# Patient Record
Sex: Female | Born: 1995
Health system: Southern US, Community
[De-identification: ages and names within clinical notes are randomized; demographics above are authoritative.]

## PROBLEM LIST (undated history)

## (undated) DIAGNOSIS — Q676 Pectus excavatum: Secondary | ICD-10-CM

## (undated) HISTORY — PX: WISDOM TOOTH EXTRACTION: SHX21

---
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
## 2020-03-31 ENCOUNTER — Other Ambulatory Visit: Payer: Self-pay

## 2021-09-28 ENCOUNTER — Encounter (HOSPITAL_COMMUNITY): Payer: Self-pay | Admitting: Emergency Medicine

## 2021-09-28 ENCOUNTER — Emergency Department (HOSPITAL_COMMUNITY)
Admission: EM | Admit: 2021-09-28 | Discharge: 2021-09-28 | Disposition: A | Discharge status: Home / Self Care | Payer: BC Managed Care – PPO | Attending: Emergency Medicine | Admitting: Emergency Medicine

## 2021-09-28 ENCOUNTER — Other Ambulatory Visit: Payer: Self-pay

## 2021-09-28 DIAGNOSIS — Z711 Person with feared health complaint in whom no diagnosis is made: Secondary | ICD-10-CM | POA: Insufficient documentation

## 2021-09-28 DIAGNOSIS — R531 Weakness: Secondary | ICD-10-CM | POA: Insufficient documentation

## 2021-09-28 DIAGNOSIS — I10 Essential (primary) hypertension: Secondary | ICD-10-CM | POA: Diagnosis not present

## 2021-09-28 LAB — URINALYSIS, ROUTINE W REFLEX MICROSCOPIC
Bilirubin Urine: NEGATIVE
Glucose, UA: NEGATIVE mg/dL
Hgb urine dipstick: NEGATIVE
Ketones, ur: NEGATIVE mg/dL
Nitrite: NEGATIVE
Protein, ur: NEGATIVE mg/dL
Specific Gravity, Urine: 1.014 (ref 1.005–1.030)
pH: 8 (ref 5.0–8.0)

## 2021-09-28 LAB — BASIC METABOLIC PANEL
Anion gap: 7 (ref 5–15)
BUN: 13 mg/dL (ref 6–20)
CO2: 26 mmol/L (ref 22–32)
Calcium: 9.6 mg/dL (ref 8.9–10.3)
Chloride: 106 mmol/L (ref 98–111)
Creatinine, Ser: 0.69 mg/dL (ref 0.44–1.00)
GFR, Estimated: >60 mL/min (ref >60)
Glucose, Bld: 120 mg/dL — ABNORMAL HIGH (ref 70–99)
Potassium: 4.3 mmol/L (ref 3.5–5.1)
Sodium: 139 mmol/L (ref 135–145)

## 2021-09-28 LAB — CBC
HCT: 46.1 % — ABNORMAL HIGH (ref 36.0–46.0)
Hemoglobin: 15.8 g/dL — ABNORMAL HIGH (ref 12.0–15.0)
MCH: 31.3 pg (ref 26.0–34.0)
MCHC: 34.3 g/dL (ref 30.0–36.0)
MCV: 91.3 fL (ref 80.0–100.0)
Platelets: 287 10*3/uL (ref 150–400)
RBC: 5.05 MIL/uL (ref 3.87–5.11)
RDW: 12.7 % (ref 11.5–15.5)
WBC: 7.7 10*3/uL (ref 4.0–10.5)
nRBC: 0 % (ref 0.0–0.2)

## 2021-09-28 LAB — I-STAT BETA HCG BLOOD, ED (MC, WL, AP ONLY): I-stat hCG, quantitative: <5 m[IU]/mL (ref <5)

## 2021-09-28 MED ORDER — SODIUM CHLORIDE 0.9 % IV BOLUS
1000.0000 mL | Freq: Once | INTRAVENOUS | Status: AC
  Administered 2021-09-28: 1000 mL via INTRAVENOUS

## 2021-09-28 NOTE — ED Triage Notes (Addendum)
Patient repots not eating since Sunday and not having much of an appetite. Starting Tuesday started to feel weak and jittery. Reports cough with white phlegm.   Patient adds she might have an eating disorder and is concerned she is very malnurished. States when trying to eat vegetables she will chew on them but the thought of them makes her nauseated and spits them out.

## 2021-09-28 NOTE — Discharge Instructions (Signed)
You were seen today for concerns of dehydration and requests for resources for outpatient assistance with eating disorders. I have provided a list of outpatient counseling services in the area. There are many different options. Please follow up with your primary care if possible for assistance as well.

## 2021-09-28 NOTE — ED Provider Notes (Signed)
[facility] Provider Note   CSN: [id number] Arrival date & time: 09/28/21  1194     History  Chief Complaint  Patient presents with   Weakness    Kristin Hodges is a 26 y.o. female.  Patient presents to the emergency department with concerns that she may have an eating disorder.  She states that she has had issues with foods over much of her life and is unable to eat vegetables due to texture.  She states that on Tuesday she felt a little bit weak and jittery.  She says she has not eaten since Sunday.  Past medical history significant for history of drug overdose, tobacco abuse, altered mental status, hypertension, hypokalemia, hypoglycemia  HPI     Home Medications Prior to Admission medications   Medication Sig Start Date End Date Taking? Authorizing Provider  acetaminophen (TYLENOL) 500 MG tablet Take 1,000 mg by mouth every 6 (six) hours as needed. For headache    [provider]      Allergies    Patient has no known allergies.    Review of Systems   Review of Systems  Constitutional:  Positive for appetite change.  Respiratory:  Negative for shortness of breath.   Cardiovascular:  Negative for chest pain.  Gastrointestinal:  Negative for abdominal pain.  Genitourinary:  Negative for dysuria.    Physical Exam Updated Vital Signs BP 117/84   Pulse 84   Temp 99.2 F (37.3 C) (Oral)   Resp 18   Ht 5\' 3"  (1.6 m)   Wt 38.1 kg   LMP 09/27/2021   SpO2 100%   BMI 14.88 kg/m  Physical Exam Vitals and nursing note reviewed.  Constitutional:      General: She is not in acute distress. HENT:     Head: Normocephalic and atraumatic.     Mouth/Throat:     Mouth: Mucous membranes are moist.  Eyes:     Conjunctiva/sclera: Conjunctivae normal.  Cardiovascular:     Rate and Rhythm: Normal rate and regular rhythm.     Pulses: Normal pulses.     Heart sounds: Normal heart sounds.  Pulmonary:     Effort: Pulmonary effort is  normal.     Breath sounds: Normal breath sounds.  Musculoskeletal:        General: Normal range of motion.     Cervical back: Normal range of motion and neck supple.  Skin:    General: Skin is warm and dry.     Capillary Refill: Capillary refill takes less than 2 seconds.  Neurological:     Mental Status: She is alert and oriented to person, place, and time.     ED Results / Procedures / Treatments   Labs (all labs ordered are listed, but only abnormal results are displayed) Labs Reviewed  BASIC METABOLIC PANEL - Abnormal; Notable for the following components:      Result Value   Glucose, Bld 120 (*)    All other components within normal limits  CBC - Abnormal; Notable for the following components:   Hemoglobin 15.8 (*)    HCT 46.1 (*)    All other components within normal limits  URINALYSIS, ROUTINE W REFLEX MICROSCOPIC - Abnormal; Notable for the following components:   Leukocytes,Ua TRACE (*)    Bacteria, UA RARE (*)    All other components within normal limits  I-STAT BETA HCG BLOOD, ED ([facility], [facility], [facility] ONLY)    EKG None  Radiology No results found.  Procedures Procedures    Medications Ordered in ED Medications  sodium chloride 0.9 % bolus 1,000 mL (1,000 mLs Intravenous New Bag/Given 09/28/21 1314)    ED Course/ Medical Decision Making/ A&P                           Medical Decision Making Amount and/or Complexity of Data Reviewed Labs: ordered.   Patient presents with a chief concern of possible malnutrition due to possible eating disorder.  I ordered and reviewed basic labs.  Trace leukocytes on urinalysis, negative pregnancy test, hemoglobin 15.8, glucose 120, rest of BMP within normal limits  I ordered the patient a liter of saline to help with her hydration.  Upon reassessment the patient felt much better and seemed much more comfortable  I have talked to the patient and listened to her concerns about having a possible eating disorder.  There are no  significant abnormalities on her basic lab work today.  Normal electrolytes.  I see no reason for admission at this time.  I have offered outpatient resources to the patient to help with her potential eating disorder.  The patient plans to follow-up outpatient.  Discharge home        Final Clinical Impression(s) / ED Diagnoses Final diagnoses:  Weakness  Mental health-related complaint    Rx / DC Orders ED Discharge Orders     None         Pamala Duffel 09/28/21 1441    Lorre Nick, MD 09/29/21 [phone]

## 2021-09-28 NOTE — ED Notes (Signed)
Pt. Unable to sign discharge agreement d/t unavailable signature pad. Pt. Is understanding of findings, follow-up and return precautions. Pt. Verbalized understanding of above.

## 2021-10-25 ENCOUNTER — Encounter (HOSPITAL_COMMUNITY): Payer: Self-pay | Admitting: Emergency Medicine

## 2021-11-03 ENCOUNTER — Emergency Department (HOSPITAL_BASED_OUTPATIENT_CLINIC_OR_DEPARTMENT_OTHER)
Admission: EM | Admit: 2021-11-03 | Discharge: 2021-11-03 | Disposition: A | Discharge status: Home / Self Care | Payer: BC Managed Care – PPO | Attending: Emergency Medicine | Admitting: Emergency Medicine

## 2021-11-03 ENCOUNTER — Emergency Department (HOSPITAL_BASED_OUTPATIENT_CLINIC_OR_DEPARTMENT_OTHER): Payer: BC Managed Care – PPO | Admitting: Radiology

## 2021-11-03 ENCOUNTER — Other Ambulatory Visit: Payer: Self-pay

## 2021-11-03 ENCOUNTER — Encounter (HOSPITAL_BASED_OUTPATIENT_CLINIC_OR_DEPARTMENT_OTHER): Payer: Self-pay

## 2021-11-03 DIAGNOSIS — M7989 Other specified soft tissue disorders: Secondary | ICD-10-CM | POA: Diagnosis not present

## 2021-11-03 DIAGNOSIS — R079 Chest pain, unspecified: Secondary | ICD-10-CM | POA: Diagnosis present

## 2021-11-03 DIAGNOSIS — R072 Precordial pain: Secondary | ICD-10-CM | POA: Insufficient documentation

## 2021-11-03 DIAGNOSIS — R0602 Shortness of breath: Secondary | ICD-10-CM | POA: Diagnosis not present

## 2021-11-03 LAB — BASIC METABOLIC PANEL
Anion gap: 8 (ref 5–15)
BUN: 11 mg/dL (ref 6–20)
CO2: 26 mmol/L (ref 22–32)
Calcium: 9.5 mg/dL (ref 8.9–10.3)
Chloride: 101 mmol/L (ref 98–111)
Creatinine, Ser: 0.65 mg/dL (ref 0.44–1.00)
GFR, Estimated: >60 mL/min (ref >60)
Glucose, Bld: 78 mg/dL (ref 70–99)
Potassium: 3.6 mmol/L (ref 3.5–5.1)
Sodium: 135 mmol/L (ref 135–145)

## 2021-11-03 LAB — CBC
HCT: 41 % (ref 36.0–46.0)
Hemoglobin: 14.1 g/dL (ref 12.0–15.0)
MCH: 30.6 pg (ref 26.0–34.0)
MCHC: 34.4 g/dL (ref 30.0–36.0)
MCV: 88.9 fL (ref 80.0–100.0)
Platelets: 270 10*3/uL (ref 150–400)
RBC: 4.61 MIL/uL (ref 3.87–5.11)
RDW: 12 % (ref 11.5–15.5)
WBC: 6.9 10*3/uL (ref 4.0–10.5)
nRBC: 0 % (ref 0.0–0.2)

## 2021-11-03 LAB — TROPONIN I (HIGH SENSITIVITY): Troponin I (High Sensitivity): <2 ng/L (ref <18)

## 2021-11-03 LAB — D-DIMER, QUANTITATIVE: D-Dimer, Quant: 0.46 ug/mL-FEU (ref 0.00–0.50)

## 2021-11-03 LAB — HCG, SERUM, QUALITATIVE: Preg, Serum: NEGATIVE

## 2021-11-03 NOTE — ED Triage Notes (Signed)
Patient here POV from Home.  Endorses Intermittent CP that has been present for a few weeks. Mostly   Some SOB associated with Same. No Fevers. No N/V.   NAD Noted during Triage. A&Ox4. GCS 15. Ambulatory.

## 2021-11-03 NOTE — ED Provider Notes (Signed)
[facility] EMERGENCY DEPT Provider Note   CSN: [id number] Arrival date & time: 11/03/21  1426    History  Chief Complaint  Patient presents with   Chest Pain    Kristin Hodges is a 26 y.o. female here for evaluation of chest pain.  Has had intermittent chest pain over the last month.  Not necessarily exertional or pleuritic in nature.  Can last for minutes to hours.  No palpitations. Some associated SOB.  Was previously under a lot of stress  however states stress has improved and her symptoms have not improved.  Does not radiate to left arm, left back or jaw.  Pain to central and right portion of her chest.  Pain can wake her up out of her sleep.  Lower extremity swelling, pain, numbness or weakness.  No history of thyroid problems, no history of PE or DVT.  No personal or family history of sudden cardiac death.  HPI     Home Medications Prior to Admission medications   Medication Sig Start Date End Date Taking? Authorizing Provider  acetaminophen (TYLENOL) 500 MG tablet Take 1,000 mg by mouth every 6 (six) hours as needed. For headache    [provider]      Allergies    Patient has no known allergies.    Review of Systems   Review of Systems  Constitutional: Negative.   HENT: Negative.    Respiratory:  Positive for shortness of breath. Negative for cough, wheezing and stridor.   Cardiovascular:  Positive for chest pain. Negative for palpitations and leg swelling.  Gastrointestinal: Negative.   Genitourinary: Negative.   Musculoskeletal: Negative.   Skin: Negative.   Neurological: Negative.   All other systems reviewed and are negative.   Physical Exam Updated Vital Signs BP 117/80   Pulse 65   Temp 98.8 F (37.1 C)   Resp 13   Ht 5\' 3"  (1.6 m)   Wt 44.5 kg   SpO2 100%   BMI 17.36 kg/m  Physical Exam Vitals and nursing note reviewed.  Constitutional:      General: She is not in acute distress.    Appearance: She is well-developed. She  is not ill-appearing, toxic-appearing or diaphoretic.  HENT:     Head: Atraumatic.  Eyes:     Pupils: Pupils are equal, round, and reactive to light.  Cardiovascular:     Rate and Rhythm: Normal rate.     Pulses:          Radial pulses are 2+ on the right side and 2+ on the left side.     Heart sounds: Normal heart sounds.  Pulmonary:     Effort: Pulmonary effort is normal. No respiratory distress.     Breath sounds: Normal breath sounds.  Chest:     Chest wall: No mass, deformity, tenderness or crepitus.  Abdominal:     General: Bowel sounds are normal. There is no distension.     Palpations: Abdomen is soft.  Musculoskeletal:        General: Normal range of motion.     Cervical back: Normal range of motion.     Right lower leg: No tenderness. No edema.     Left lower leg: No tenderness. No edema.  Skin:    General: Skin is warm and dry.     Capillary Refill: Capillary refill takes less than 2 seconds.  Neurological:     General: No focal deficit present.     Mental Status: She is  alert.  Psychiatric:        Mood and Affect: Mood normal.     ED Results / Procedures / Treatments   Labs (all labs ordered are listed, but only abnormal results are displayed) Labs Reviewed  BASIC METABOLIC PANEL  CBC  HCG, SERUM, QUALITATIVE  D-DIMER, QUANTITATIVE  TROPONIN I (HIGH SENSITIVITY)  TROPONIN I (HIGH SENSITIVITY)    EKG None  Radiology DG Chest 2 View  Result Date: 11/03/2021 CLINICAL DATA:  Chest pain in a 26 year old female. EXAM: CHEST - 2 VIEW COMPARISON:  June 23, 2010 FINDINGS: Trachea midline. Cardiomediastinal contours and hilar structures are normal. No lobar consolidation or effusion. Lungs are hyperinflated with increased retrosternal clear space. Mild central airway thickening. On limited assessment no acute skeletal findings. Query mild deformity of the inferior sternum and pectus excavatum. IMPRESSION: Hyperinflation and mild central airway thickening  suggesting reactive airway disease or bronchitis. No lobar consolidation or effusion. Query mild pectus excavatum and mild deformity of the inferior sternum. Correlate with any point tenderness in this area or any history of recent trauma. Electronically Signed   By: Donzetta Kohut M.D.   On: 11/03/2021 15:45    Procedures Procedures    Medications Ordered in ED Medications - No data to display  ED Course/ Medical Decision Making/ A&P    26 year old here for evaluation of intermittent chest pain over the last month or so.  No clear precipitating events.  Nonexertional, nonpleuritic in nature.  She is no clinical evidence of VTE on exam.  There is some mild tachycardia when I was examining her to 100s, cannot PERC.  Symptoms do not seem consistent with ACS, dissection, infectious process no associated cough.  Labs and imaging personally viewed and interpreted:  CBC without leukocytosis Metabolic panel without significant normality Pregnancy test negative Troponin less than 2 Chest x-ray with possible reactive airway disease, possible mild pectus excavatum EKG without ischemic changes Ddimer 0.46  Reassessed.  Stated just leave the emergency department right away for an emergency.  Her work-up thus far is unremarkable.  Her symptoms have been ongoing over the last month.  Do not feel she needs a delta troponin.  We will have her follow-up outpatient with PCP or cardiology.  Can return for new or worsening symptoms.  The patient has been appropriately medically screened and/or stabilized in the ED. I have low suspicion for any other emergent medical condition which would require further screening, evaluation or treatment in the ED or require inpatient management.  Patient is hemodynamically stable and in no acute distress.  Patient able to ambulate in department prior to ED.  Evaluation does not show acute pathology that would require ongoing or additional emergent interventions while in the  emergency department or further inpatient treatment.  I have discussed the diagnosis with the patient and answered all questions.  Pain is been managed while in the emergency department and patient has no further complaints prior to discharge.  Patient is comfortable with plan discussed in room and is stable for discharge at this time.  I have discussed strict return precautions for returning to the emergency department.  Patient was encouraged to follow-up with PCP/specialist refer to at discharge.                           Medical Decision Making Amount and/or Complexity of Data Reviewed Independent Historian: friend External Data Reviewed: labs, radiology, ECG and notes. Labs: ordered. Decision-making details documented in  ED Course. Radiology: ordered and independent interpretation performed. Decision-making details documented in ED Course. ECG/medicine tests: ordered and independent interpretation performed. Decision-making details documented in ED Course.  Risk OTC drugs. Prescription drug management. Parenteral controlled substances. Decision regarding hospitalization. Diagnosis or treatment significantly limited by social determinants of health.          Final Clinical Impression(s) / ED Diagnoses Final diagnoses:  Precordial pain    Rx / DC Orders ED Discharge Orders     None         Adilee Lemme A, PA-C 11/03/21 1809    Tegeler, Canary Brim, MD 11/04/21 0003

## 2021-11-03 NOTE — Discharge Instructions (Addendum)
Make sure to call the cardiologist and schedule appointment or follow-up with a primary care doctor  Return for new or worsening symptoms
